# Patient Record
Sex: Male | Born: 1975 | Race: White | Hispanic: No | Marital: Single | Smoking: Never smoker
Health system: Southern US, Community
[De-identification: ages and names within clinical notes are randomized; demographics above are authoritative.]

## PROBLEM LIST (undated history)

## (undated) DIAGNOSIS — M109 Gout, unspecified: Secondary | ICD-10-CM

## (undated) DIAGNOSIS — I10 Essential (primary) hypertension: Secondary | ICD-10-CM

---

## 2012-11-29 ENCOUNTER — Encounter (HOSPITAL_COMMUNITY): Payer: Self-pay | Admitting: Emergency Medicine

## 2012-11-29 ENCOUNTER — Emergency Department (HOSPITAL_COMMUNITY)
Admission: EM | Admit: 2012-11-29 | Discharge: 2012-11-29 | Disposition: A | Discharge status: Home / Self Care | Payer: Self-pay | Attending: Emergency Medicine | Admitting: Emergency Medicine

## 2012-11-29 DIAGNOSIS — Z79899 Other long term (current) drug therapy: Secondary | ICD-10-CM | POA: Insufficient documentation

## 2012-11-29 DIAGNOSIS — M79606 Pain in leg, unspecified: Secondary | ICD-10-CM

## 2012-11-29 DIAGNOSIS — M79609 Pain in unspecified limb: Secondary | ICD-10-CM | POA: Insufficient documentation

## 2012-11-29 DIAGNOSIS — M109 Gout, unspecified: Secondary | ICD-10-CM | POA: Insufficient documentation

## 2012-11-29 DIAGNOSIS — M7989 Other specified soft tissue disorders: Secondary | ICD-10-CM | POA: Insufficient documentation

## 2012-11-29 DIAGNOSIS — M545 Low back pain, unspecified: Secondary | ICD-10-CM | POA: Insufficient documentation

## 2012-11-29 DIAGNOSIS — I10 Essential (primary) hypertension: Secondary | ICD-10-CM | POA: Insufficient documentation

## 2012-11-29 HISTORY — DX: Gout, unspecified: M10.9

## 2012-11-29 HISTORY — DX: Essential (primary) hypertension: I10

## 2012-11-29 MED ORDER — IBUPROFEN 600 MG PO TABS: 600.0000 mg | ORAL_TABLET | Freq: Four times a day (QID) | ORAL | Status: AC | PRN

## 2012-11-29 MED ORDER — METHOCARBAMOL 500 MG PO TABS: 1000.0000 mg | ORAL_TABLET | Freq: Four times a day (QID) | ORAL | Status: AC

## 2012-11-29 MED ORDER — OXYCODONE-ACETAMINOPHEN 5-325 MG PO TABS
1.0000 | ORAL_TABLET | Freq: Once | ORAL | Status: AC
  Administered 2012-11-29: 1 via ORAL
  Filled 2012-11-29: qty 1

## 2012-11-29 MED ORDER — DIAZEPAM 5 MG PO TABS
5.0000 mg | ORAL_TABLET | Freq: Once | ORAL | Status: AC
  Administered 2012-11-29: 5 mg via ORAL
  Filled 2012-11-29: qty 1

## 2012-11-29 MED ORDER — OXYCODONE-ACETAMINOPHEN 5-325 MG PO TABS: 1.0000 | ORAL_TABLET | Freq: Four times a day (QID) | ORAL | Status: AC | PRN

## 2012-11-29 NOTE — ED Provider Notes (Signed)
History     CSN: [id number]  Arrival date & time 11/29/12  1025   First MD Initiated Contact with Patient 11/29/12 1107      Chief Complaint  Patient presents with  . Leg Pain    pain in l/thigh,x 4 days    (Consider location/radiation/quality/duration/timing/severity/associated sxs/prior treatment) HPI Comments: Patient presents with complaint of left lower extremity pain for the past 4 days. Pain has been gradually worsening. It started when he "tweaked" his back while moving boxes 4 days ago. Initially he had some mild back soreness which is not unusual for him. Since that time the pain has become worse in his left thigh. It is described as a tightness. It radiates to his left calf. Patient states it feels like his calf is tight and swollen. He has been taking Ibuprofen without relief. The pain is worse with standing and walking. He does not have a history of radicular pain. Patient does and drive and fly frequently due to his job. Onset acute. Gradually worsening. Rest makes symptoms better.   The history is provided by the patient.    Past Medical History  Diagnosis Date  . Hypertension   . Gout     History reviewed. No pertinent past surgical history.  Family History  Problem Relation Age of Onset  . Hypertension Mother     History  Substance Use Topics  . Smoking status: Never Smoker   . Smokeless tobacco: Not on file  . Alcohol Use: Yes      Review of Systems  Constitutional: Negative for fever and unexpected weight change.  HENT: Negative for sore throat and rhinorrhea.   Eyes: Negative for redness.  Respiratory: Negative for cough.   Cardiovascular: Positive for leg swelling. Negative for chest pain.  Gastrointestinal: Negative for nausea, vomiting, abdominal pain, diarrhea and constipation.       Neg for fecal incontinence  Genitourinary: Negative for dysuria, hematuria, flank pain and difficulty urinating.       Negative for urinary incontinence or  retention  Musculoskeletal: Positive for myalgias and back pain.  Skin: Negative for rash.  Neurological: Negative for weakness, numbness and headaches.       Negative for saddle paresthesias     Allergies  Review of patient's allergies indicates no known allergies.  Home Medications   Current Outpatient Rx  Name  Route  Sig  Dispense  Refill  . COLCHICINE 0.6 MG PO TABS   Oral   Take 0.6 mg by mouth daily.         . IBUPROFEN 200 MG PO TABS   Oral   Take 800 mg by mouth every 8 (eight) hours as needed. For pain.         Marland Kitchen TELMISARTAN 40 MG PO TABS   Oral   Take 40 mg by mouth daily.           BP 130/66  Pulse 117  Temp 98 F (36.7 C) (Oral)  Resp 18  Wt 350 lb (158.759 kg)  SpO2 98%  Physical Exam  Nursing note and vitals reviewed. Constitutional: He appears well-developed and well-nourished.  HENT:  Head: Normocephalic and atraumatic.  Eyes: Conjunctivae normal are normal. Right eye exhibits no discharge. Left eye exhibits no discharge.  Neck: Normal range of motion. Neck supple.  Cardiovascular: Regular rhythm and normal heart sounds.  Tachycardia present.   Pulmonary/Chest: Effort normal and breath sounds normal.  Abdominal: Soft. There is no tenderness.  Musculoskeletal:  No calf tenderness to palpation. No gross or pitting edema.   Neurological: He is alert. No sensory deficit. He exhibits normal muscle tone.       Sensation and strength intact in L LE. + straight leg test.   Skin: Skin is warm and dry.  Psychiatric: He has a normal mood and affect.    ED Course  Procedures (including critical care time)  Labs Reviewed - No data to display No results found.   1. Leg pain     11:17 AM Patient seen and examined. Will get [place] to rule out DVT given history of frequent travel and flying -- although my suspicion for DVT is low.    Vital signs reviewed and are as follows: Filed Vitals:   11/29/12 1051  BP: 130/66  Pulse: 117  Temp: 98 F  (36.7 C)  Resp: 18   12:52 PM [place] neg. Pt d/w Dr. Anitra Lauth. Patient reports pain is improved but not gone.  No red flag s/s of low back pain. Patient was counseled on back pain precautions and told to do activity as tolerated but do not lift, push, or pull heavy objects more than 10 pounds for the next week.  Patient counseled to use ice or heat on back for no longer than 15 minutes every hour.   Patient prescribed muscle relaxer and counseled on proper use of muscle relaxant medication.    Patient prescribed narcotic pain medicine and counseled on proper use of narcotic pain medications. Counseled not to combine this medication with others containing tylenol.   Urged patient not to drink alcohol, drive, or perform any other activities that requires focus while taking either of these medications.  Patient urged to follow-up with PCP if pain does not improve with treatment and rest or if pain becomes recurrent. Urged to return with worsening severe pain, loss of bowel or bladder control, trouble walking.   The patient verbalizes understanding and agrees with the plan.   MDM  Leg pain -- likely radicular. DVT ruled out given history. Will treat conservatively.   No neurological deficits. Patient is ambulatory. No warning symptoms of back pain including: loss of bowel or bladder control, night sweats, waking from sleep with back pain, unexplained fevers or weight loss, h/o cancer, IVDU, recent trauma. Doubt spinal stenosis. No concern for cauda equina, epidural abscess, or other serious cause of back pain. Conservative measures such as rest, ice/heat and pain medicine indicated with PCP follow-up if no improvement with conservative management.         Renne Crigler, [place] 11/29/12 1253

## 2012-11-29 NOTE — Progress Notes (Signed)
*  PRELIMINARY RESULTS* Vascular Ultrasound Left lower extremity venous duplex has been completed.  Preliminary findings: Left:  No evidence of DVT, superficial thrombosis, or Baker's cyst.  Farrel Demark, RDMS, RVT 11/29/2012, 11:47 AM

## 2012-11-29 NOTE — ED Notes (Signed)
Pain l/thigh after lifting boxes 4 days ago. Pt reports numbness in l/foot x 2 days. Pain increased with standing

## 2012-11-29 NOTE — ED Provider Notes (Signed)
Medical screening examination/treatment/procedure(s) were performed by non-physician practitioner and as supervising physician I was immediately available for consultation/collaboration.   Derin Matthes, MD 11/29/12 1448 

## 2012-11-29 NOTE — Progress Notes (Signed)
Pt reports recently moving to [place] from [place]. Previous [place] MD was Dr Beverely Risen.  Partnership for [facility] liaison spoke with him Pt offered services to assist with finding a [place] self pay provider & health reform information

## 2012-11-29 NOTE — ED Notes (Signed)
Pt states was getting christmas decorations out the attic this past weekend when he hurt his back, then the day after started having L thigh pain, has progressively worsened, having some numbness in L calf and foot. Pt states hurts worse when standing or walking. No swelling noted. Pt states still having slight back pain also.

## 2013-10-16 ENCOUNTER — Ambulatory Visit (HOSPITAL_BASED_OUTPATIENT_CLINIC_OR_DEPARTMENT_OTHER): Payer: Self-pay

## 2013-10-23 ENCOUNTER — Ambulatory Visit (HOSPITAL_BASED_OUTPATIENT_CLINIC_OR_DEPARTMENT_OTHER): Payer: BC Managed Care – PPO | Attending: Family Medicine | Admitting: Radiology

## 2013-10-23 VITALS — Ht 71.0 in | Wt 345.0 lb

## 2013-10-23 DIAGNOSIS — G4733 Obstructive sleep apnea (adult) (pediatric): Secondary | ICD-10-CM | POA: Insufficient documentation

## 2013-10-23 DIAGNOSIS — G4734 Idiopathic sleep related nonobstructive alveolar hypoventilation: Secondary | ICD-10-CM | POA: Insufficient documentation

## 2013-10-26 DIAGNOSIS — G4733 Obstructive sleep apnea (adult) (pediatric): Secondary | ICD-10-CM

## 2013-10-27 NOTE — Procedures (Signed)
NAME:  Jacob Cantrell, POKE NO.:  [id number]  MEDICAL RECORD NO.:  [id number]          PATIENT TYPE:  OUT  LOCATION:  SLEEP CENTER                 FACILITY:  [facility]  PHYSICIAN:  Ebert Forrester D. Maple Hudson, MD, FCCP, FACPDATE OF BIRTH:  05[birth year]  DATE OF STUDY:  10/23/2013                           NOCTURNAL POLYSOMNOGRAM  REFERRING PHYSICIAN:  TUBER VANG  INDICATION FOR STUDY:  Hypersomnia with sleep apnea.  EPWORTH SLEEPINESS SCORE:  16/24.  BMI 48.1, weight 345 pounds, height 71 inches.  Neck 19.5 inches.  MEDICATIONS:  Home medications are charted for review.  SLEEP ARCHITECTURE:  Total sleep time 362 minutes with sleep efficiency 94.6%.  Stage I was 2.1%, stage II 76.7%, stage III absent, REM 21.3% of total sleep time.  Sleep latency 12.5 minutes, REM latency 75.5 minutes. Awake after sleep onset 2 minutes.  Arousal index 110.1.  BEDTIME MEDICATION:  None.  RESPIRATORY DATA:  Apnea-hypopnea index (AHI) 117.5 per hour.  A total of 709 events was scored including 669 obstructive apneas, 33 mixed apneas, 7 hypopneas.  Events were not positional.  REM/AHI 93.5 per hour.  This study was ordered as a diagnostic NPSG protocol without CPAP.  OXYGEN DATA:  Moderate snoring with oxygen desaturation to a nadir of 55% and mean oxygen saturation through the study of 82.8% on room air.  CARDIAC DATA:  Sinus rhythm.  MOVEMENT-PARASOMNIA:  No significant movement disturbance.  Bathroom x1.  IMPRESSIONS-RECOMMENDATIONS: 1. Very severe obstructive sleep apnea/hypopnea syndrome, AHI 117.5     per hour with non-positional events.  REM AHI 93.5 per hour.     Moderate snoring with oxygen desaturation to a nadir of 55% and     mean oxygen saturation through the study of 82.8% on room air. 2. This study was ordered as a diagnostic NPSG protocol and CPAP     titration was not done.  Recommend early return for dedicated CPAP     titration study unless other therapy is felt more  appropriate. 3. Oxygen saturation while awake on room air was 97%.  Sustained     hypoxemia during the sleep study is likely     due to his sleep apnea.  If this does not respond to treatment for     obstructive apnea, then he may need evaluation for supplemental     home oxygen as additional therapy.     Tranice Laduke D. Maple Hudson, MD, [facility], FACP Diplomate, American Board of Sleep Medicine    CDY/MEDQ  D:  10/26/2013 11:30:54  T:  10/27/2013 02:48:25  Job:  [id number]

## 2013-12-09 ENCOUNTER — Ambulatory Visit (HOSPITAL_BASED_OUTPATIENT_CLINIC_OR_DEPARTMENT_OTHER): Payer: BC Managed Care – PPO | Attending: Family Medicine

## 2013-12-09 DIAGNOSIS — G4710 Hypersomnia, unspecified: Secondary | ICD-10-CM | POA: Insufficient documentation

## 2013-12-09 DIAGNOSIS — G4733 Obstructive sleep apnea (adult) (pediatric): Secondary | ICD-10-CM

## 2013-12-14 DIAGNOSIS — G4733 Obstructive sleep apnea (adult) (pediatric): Secondary | ICD-10-CM

## 2013-12-14 NOTE — Sleep Study (Signed)
   NAME: Jacob Cantrell DATE OF BIRTH:  July 18, 1976 MEDICAL RECORD NUMBER [id number]  LOCATION: [facility]  PHYSICIAN: YOUNG,CLINTON D  DATE OF STUDY: 12/09/2013  SLEEP STUDY TYPE: Nocturnal Polysomnogram               REFERRING PHYSICIAN: Vang, Tuber, MD  INDICATION FOR STUDY: Hypersomnia with sleep apnea. A baseline nocturnal polysomnogram on 10/23/2013 recorded AHI 117.5 per hour with body weight 345 pounds. CPAP titration is requested.  EPWORTH SLEEPINESS SCORE:   16/24 HEIGHT:   5 feet 11 inches WEIGHT:   340 pounds NECK SIZE: 19.5 in.  MEDICATIONS: Charted for review  SLEEP ARCHITECTURE: Total sleep time 316.5 minutes with sleep efficiency 84.7%. Stage I was 5.1%, stage II 38.2%, stage III 0.2%, REM 56.6% of total sleep time. Sleep latency 44.5 minutes, REM latency 54.5 minutes, awake after sleep onset 12.5 minutes, arousal index 15.4. Bedtime medication: None  RESPIRATORY DATA: CPAP titration protocol. CPAP was titrated to 17 CWP with residual AHI of 5.5 per hour. With continued apneas and mask leak at that pressure, the technician changed to bilevel and titrated to a final inspiratory pressure of 19 and expiratory pressure of 16 for a residual AHI of 3.2 per hour. He wore a medium ResMed air fit F10 fullface mask with heated humidifier.   OXYGEN DATA: Snoring was prevented and mean oxygen saturation held 94.5% on room air with CPAP  CARDIAC DATA: Sinus rhythm with PACs and PVCs  MOVEMENT/PARASOMNIA: A few incidental limb jerks were noted with no effect on sleep. No bathroom trips.  IMPRESSION/ RECOMMENDATION:   1) CPAP titration to 17 CWP, AHI 5.5 per hour. This may be adequate for initial home trial. However the technician noted persistent apneas and mask leak, and changed for trial of bilevel PAP. This was titrated to a final inspiratory pressure of 19 and expiratory pressure of 16 CWP for a residual AHI of 3.2 per hour. He wore a medium ResMed air fit F10  fullface mask with heated humidifier. 2)  REM rebound was noted, reflecting significant improvement in sleep maintenance with CPAP. 3) Baseline nocturnal polysomnogram on 10/23/2013 recorded AHI 117.5 per hour with body weight 345 pounds.  Signed CD Maple Hudson, M.D. Waymon Budge Diplomate, American Board of Sleep Medicine  ELECTRONICALLY SIGNED ON:  12/14/2013, 11:15 AM [facility] PH: [phone]   FX: [phone] ACCREDITED BY THE AMERICAN ACADEMY OF SLEEP MEDICINE

## 2014-07-14 ENCOUNTER — Ambulatory Visit (HOSPITAL_COMMUNITY)
Admit: 2014-07-14 | Discharge: 2014-07-14 | Disposition: A | Discharge status: Home / Self Care | Payer: BC Managed Care – PPO | Source: Ambulatory Visit | Attending: Family Medicine | Admitting: Family Medicine

## 2014-07-14 ENCOUNTER — Encounter (HOSPITAL_COMMUNITY): Payer: Self-pay | Admitting: Emergency Medicine

## 2014-07-14 ENCOUNTER — Emergency Department (HOSPITAL_COMMUNITY)
Admission: EM | Admit: 2014-07-14 | Discharge: 2014-07-14 | Disposition: A | Discharge status: Home / Self Care | Payer: BC Managed Care – PPO | Source: Home / Self Care | Attending: Emergency Medicine | Admitting: Emergency Medicine

## 2014-07-14 DIAGNOSIS — R059 Cough, unspecified: Secondary | ICD-10-CM | POA: Insufficient documentation

## 2014-07-14 DIAGNOSIS — R05 Cough: Secondary | ICD-10-CM | POA: Insufficient documentation

## 2014-07-14 DIAGNOSIS — J069 Acute upper respiratory infection, unspecified: Secondary | ICD-10-CM

## 2014-07-14 DIAGNOSIS — H60399 Other infective otitis externa, unspecified ear: Secondary | ICD-10-CM

## 2014-07-14 DIAGNOSIS — H6980 Other specified disorders of Eustachian tube, unspecified ear: Secondary | ICD-10-CM

## 2014-07-14 MED ORDER — BENZONATATE 100 MG PO CAPS: 100.0000 mg | ORAL_CAPSULE | Freq: Three times a day (TID) | ORAL | Status: AC

## 2014-07-14 MED ORDER — AZITHROMYCIN 250 MG PO TABS: 250.0000 mg | ORAL_TABLET | Freq: Every day | ORAL | Status: AC

## 2014-07-14 NOTE — ED Provider Notes (Signed)
CSN: [id number]     Arrival date & time 07/14/14  1138 History   First MD Initiated Contact with Patient 07/14/14 1212     Chief Complaint  Patient presents with  . Cough   (Consider location/radiation/quality/duration/timing/severity/associated sxs/prior Treatment) HPI Comments: NO dyspnea or chest pain.   Patient is a 38 y.o. male presenting with cough. The history is provided by the patient.  Cough Cough characteristics:  Productive Sputum characteristics:  Green and brown Severity:  Moderate Onset quality:  Gradual Duration:  1 week Timing:  Constant Progression:  Worsening Chronicity:  New Smoker: no   Associated symptoms: ear fullness, myalgias, rhinorrhea and sinus congestion   Associated symptoms: no chest pain, no chills, no diaphoresis, no ear pain, no eye discharge, no fever, no headaches, no rash, no shortness of breath, no sore throat, no weight loss and no wheezing   Associated symptoms comment:  +reports scratchy throat and nasal congestion   Past Medical History  Diagnosis Date  . Hypertension   . Gout    History reviewed. No pertinent past surgical history. Family History  Problem Relation Age of Onset  . Hypertension Mother    History  Substance Use Topics  . Smoking status: Never Smoker   . Smokeless tobacco: Not on file  . Alcohol Use: Yes    Review of Systems  Constitutional: Negative for fever, chills, weight loss and diaphoresis.  HENT: Positive for rhinorrhea. Negative for ear pain and sore throat.   Eyes: Negative for discharge.  Respiratory: Positive for cough. Negative for shortness of breath and wheezing.   Cardiovascular: Negative for chest pain.  Musculoskeletal: Positive for myalgias.  Skin: Negative for rash.  Neurological: Negative for headaches.  All other systems reviewed and are negative.   Allergies  Review of patient's allergies indicates no known allergies.  Home Medications   Prior to Admission medications    Medication Sig Start Date End Date Taking? Authorizing Provider  azithromycin (ZITHROMAX) 250 MG tablet Take 1 tablet (250 mg total) by mouth daily. Take first 2 tablets together, then 1 every day until finished. 07/14/14   Ardis RowanJennifer Lee Neiman Roots, PA  benzonatate (TESSALON) 100 MG capsule Take 1 capsule (100 mg total) by mouth every 8 (eight) hours. 07/14/14   Ardis RowanJennifer Lee Oaklynn Stierwalt, PA  colchicine 0.6 MG tablet Take 0.6 mg by mouth daily.    Historical Provider, MD  ibuprofen (ADVIL,MOTRIN) 600 MG tablet Take 1 tablet (600 mg total) by mouth every 6 (six) hours as needed for pain. 11/29/12   Renne CriglerJoshua Geiple, PA-C  methocarbamol (ROBAXIN) 500 MG tablet Take 2 tablets (1,000 mg total) by mouth 4 (four) times daily. 11/29/12   Renne CriglerJoshua Geiple, PA-C  oxyCODONE-acetaminophen (PERCOCET/ROXICET) 5-325 MG per tablet Take 1-2 tablets by mouth every 6 (six) hours as needed for pain. 11/29/12   Renne CriglerJoshua Geiple, PA-C  telmisartan (MICARDIS) 40 MG tablet Take 40 mg by mouth daily.    Historical Provider, MD   BP 135/98  Pulse 120  Temp(Src) 98.6 F (37 C) (Oral)  Resp 20  Ht 5\' 11"  (1.803 m)  Wt 345 lb (156.491 kg)  BMI 48.14 kg/m2  SpO2 98% Physical Exam  Nursing note and vitals reviewed. Constitutional: He is oriented to person, place, and time. He appears well-developed and well-nourished. No distress.  +obese  HENT:  Head: Normocephalic and atraumatic.  Right Ear: Hearing, tympanic membrane, external ear and ear canal normal.  Left Ear: Hearing, tympanic membrane, external ear and ear canal normal.  Nose:  Nose normal.  Mouth/Throat: Uvula is midline, oropharynx is clear and moist and mucous membranes are normal. No oral lesions. No trismus in the jaw. No uvula swelling.  Eyes: Conjunctivae are normal. No scleral icterus.  Neck: Normal range of motion. Neck supple.  Cardiovascular: Regular rhythm and normal heart sounds.  Tachycardia present.   Pulmonary/Chest: Effort normal and breath sounds normal. No  respiratory distress. He has no wheezes.  Musculoskeletal: Normal range of motion.  Neurological: He is alert and oriented to person, place, and time.  Skin: Skin is warm and dry.  Psychiatric: He has a normal mood and affect. His behavior is normal.    ED Course  Procedures (including critical care time) Labs Review Labs Reviewed - No data to display  Imaging Review Dg Chest 2 View  07/14/2014   CLINICAL DATA:  Cough  EXAM: CHEST  2 VIEW  COMPARISON:  None.  FINDINGS: The heart size and mediastinal contours are within normal limits. Both lungs are clear. The visualized skeletal structures are unremarkable.  IMPRESSION: No active cardiopulmonary disease.   Electronically Signed   By: Marlan Palau M.D.   On: 07/14/2014 13:32     MDM   1. Protracted URI    While films for this patient are negative for CAP, I am still concerned regarding possibility of atypical pulmonary infection given prolonged course of illness and tachycardia.  I am not concerned that this patient has PE as he does not have dyspnea nor chest pain.  Will treat with 5 days or azithromycin and tessalon for cough and advise PCP follow up if no improvement.    Jess Barters [place], [place] 07/14/14 1354

## 2014-07-14 NOTE — ED Notes (Signed)
To radiology for xray

## 2014-07-14 NOTE — ED Provider Notes (Signed)
Medical screening examination/treatment/procedure(s) were performed by resident physician or non-physician practitioner and as supervising physician I was immediately available for consultation/collaboration.  Fay Swider, MD     Javonte Elenes J Atsushi Yom, MD 07/14/14 1421 

## 2014-07-14 NOTE — Discharge Instructions (Signed)
°  Your chest xray was without evidence of pneumonia, however, I am concerned that your symptoms have lasted > 1 week and you continue to feel unwell. Please take medications as prescribed and if you symptoms do not begin to improve, please follow up with your doctor. If symptoms become suddenly worse or severe, please report to your nearest ER for assistance.

## 2014-07-14 NOTE — ED Notes (Signed)
C/o cough for couple of days w green/brown secretions

## 2015-11-28 IMAGING — CR DG CHEST 2V
2 series · 2 of 2 positions shown · non-contrast
Comparison: None.

CLINICAL DATA: Cough

EXAM:
CHEST  2 VIEW

[w chest pa]
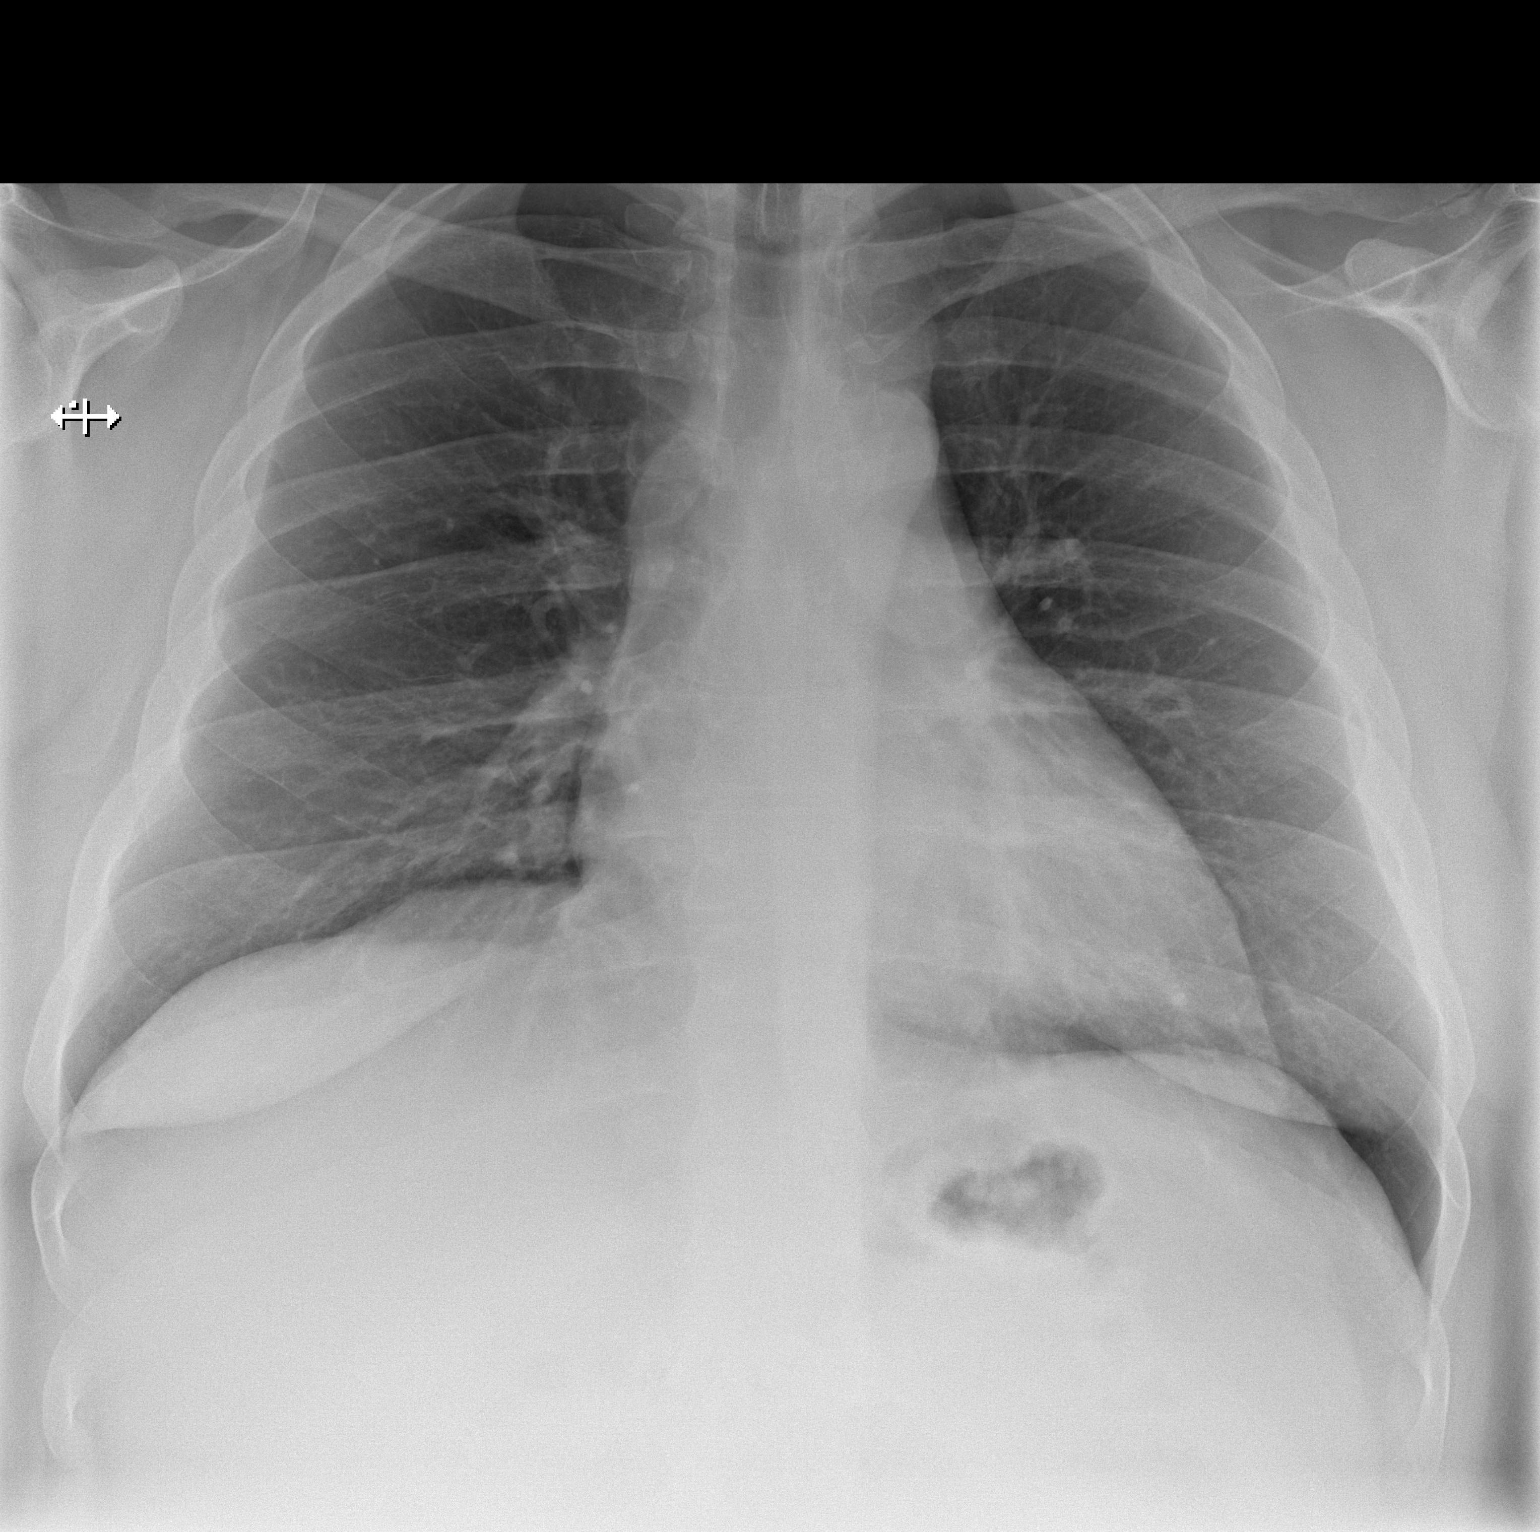

[w chest lat]
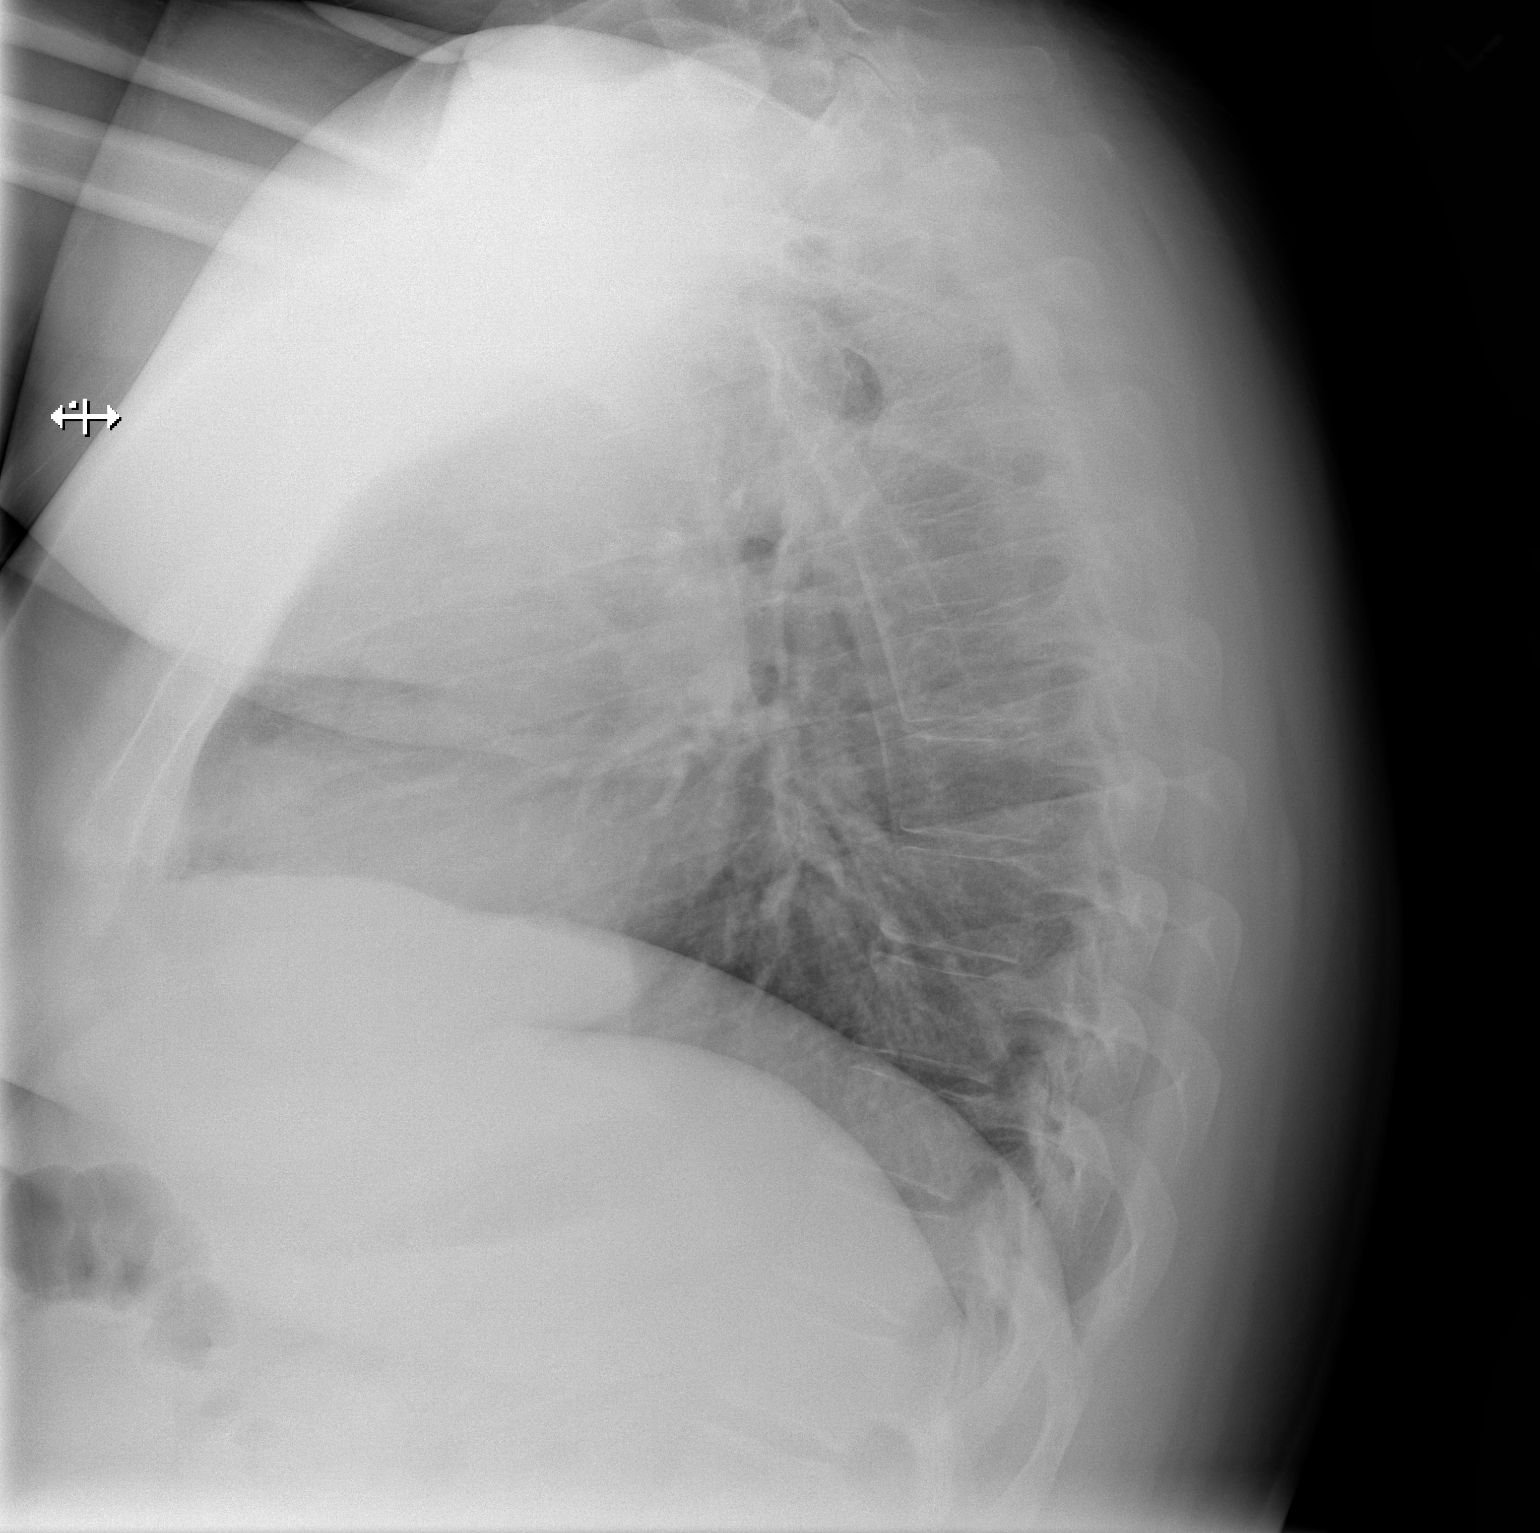

[2 of 2 positions shown; findings below may reference images not displayed]

FINDINGS: The heart size and mediastinal contours are within normal limits.
Both lungs are clear. The visualized skeletal structures are
unremarkable.
IMPRESSION: No active cardiopulmonary disease.

## 2024-04-18 DEATH — deceased
